# Patient Record
Sex: Male | Born: 1990 | Race: Black or African American | Marital: Single | Smoking: Current every day smoker
Health system: Southern US, Community
[De-identification: ages and names within clinical notes are randomized; demographics above are authoritative.]

---

## 2014-05-16 ENCOUNTER — Emergency Department (HOSPITAL_COMMUNITY): Payer: No Typology Code available for payment source

## 2014-05-16 ENCOUNTER — Encounter (HOSPITAL_COMMUNITY): Payer: Self-pay | Admitting: Emergency Medicine

## 2014-05-16 ENCOUNTER — Emergency Department (HOSPITAL_COMMUNITY)
Admission: EM | Admit: 2014-05-16 | Discharge: 2014-05-17 | Disposition: A | Discharge status: Home / Self Care | Payer: No Typology Code available for payment source | Attending: Emergency Medicine | Admitting: Emergency Medicine

## 2014-05-16 DIAGNOSIS — F17200 Nicotine dependence, unspecified, uncomplicated: Secondary | ICD-10-CM | POA: Insufficient documentation

## 2014-05-16 DIAGNOSIS — R51 Headache: Secondary | ICD-10-CM | POA: Insufficient documentation

## 2014-05-16 DIAGNOSIS — M2550 Pain in unspecified joint: Secondary | ICD-10-CM | POA: Insufficient documentation

## 2014-05-16 DIAGNOSIS — S40019A Contusion of unspecified shoulder, initial encounter: Secondary | ICD-10-CM | POA: Insufficient documentation

## 2014-05-16 DIAGNOSIS — R21 Rash and other nonspecific skin eruption: Secondary | ICD-10-CM | POA: Insufficient documentation

## 2014-05-16 DIAGNOSIS — Y939 Activity, unspecified: Secondary | ICD-10-CM | POA: Insufficient documentation

## 2014-05-16 DIAGNOSIS — T148XXA Other injury of unspecified body region, initial encounter: Secondary | ICD-10-CM
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
## 2014-05-16 NOTE — ED Notes (Signed)
EKG given to [facility], MD. No new orders at this time.

## 2014-05-17 ENCOUNTER — Emergency Department (HOSPITAL_COMMUNITY): Payer: No Typology Code available for payment source

## 2014-05-17 MED ORDER — IBUPROFEN 400 MG PO TABS
600.0000 mg | ORAL_TABLET | Freq: Once | ORAL | Status: AC
  Administered 2014-05-17: 600 mg via ORAL
  Filled 2014-05-17 (×2): qty 1

## 2014-05-17 MED ORDER — HYDROCODONE-ACETAMINOPHEN 5-325 MG PO TABS
1.0000 | ORAL_TABLET | Freq: Once | ORAL | Status: AC
  Administered 2014-05-17: 1 via ORAL
  Filled 2014-05-17: qty 1

## 2014-05-17 MED ORDER — IBUPROFEN 600 MG PO TABS: 600.0000 mg | ORAL_TABLET | Freq: Four times a day (QID) | ORAL | Status: DC | PRN
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
## 2015-10-25 ENCOUNTER — Emergency Department (INDEPENDENT_AMBULATORY_CARE_PROVIDER_SITE_OTHER)
Admission: EM | Admit: 2015-10-25 | Discharge: 2015-10-25 | Disposition: A | Discharge status: Home / Self Care | Payer: 59 | Source: Home / Self Care

## 2015-10-25 ENCOUNTER — Encounter (HOSPITAL_COMMUNITY): Payer: Self-pay | Admitting: Emergency Medicine

## 2015-10-25 DIAGNOSIS — K047 Periapical abscess without sinus: Secondary | ICD-10-CM | POA: Diagnosis not present

## 2015-10-25 MED ORDER — KETOROLAC TROMETHAMINE 60 MG/2ML IM SOLN
INTRAMUSCULAR | Status: AC
  Filled 2015-10-25: qty 2

## 2015-10-25 MED ORDER — AMOXICILLIN 875 MG PO TABS: 875.0000 mg | ORAL_TABLET | Freq: Two times a day (BID) | ORAL | Status: DC

## 2015-10-25 MED ORDER — KETOROLAC TROMETHAMINE 60 MG/2ML IM SOLN
60.0000 mg | Freq: Once | INTRAMUSCULAR | Status: AC
  Administered 2015-10-25: 60 mg via INTRAMUSCULAR

## 2015-10-25 NOTE — Discharge Instructions (Signed)
Abscess An abscess is an infected area that contains a collection of pus and debris.It can occur in almost any part of the body. An abscess is also known as a furuncle or boil. CAUSES  An abscess occurs when tissue gets infected. This can occur from blockage of oil or sweat glands, infection of hair follicles, or a minor injury to the skin. As the body tries to fight the infection, pus collects in the area and creates pressure under the skin. This pressure causes pain. People with weakened immune systems have difficulty fighting infections and get certain abscesses more often.  SYMPTOMS Usually an abscess develops on the skin and becomes a painful mass that is red, warm, and tender. If the abscess forms under the skin, you may feel a moveable soft area under the skin. Some abscesses break open (rupture) on their own, but most will continue to get worse without care. The infection can spread deeper into the body and eventually into the bloodstream, causing you to feel ill.  DIAGNOSIS  Your caregiver will take your medical history and perform a physical exam. A sample of fluid may also be taken from the abscess to determine what is causing your infection. TREATMENT  Your caregiver may prescribe antibiotic medicines to fight the infection. However, taking antibiotics alone usually does not cure an abscess. Your caregiver may need to make a small cut (incision) in the abscess to drain the pus. In some cases, gauze is packed into the abscess to reduce pain and to continue draining the area. HOME CARE INSTRUCTIONS   Only take over-the-counter or prescription medicines for pain, discomfort, or fever as directed by your caregiver.  If you were prescribed antibiotics, take them as directed. Finish them even if you start to feel better.  If gauze is used, follow your caregiver's directions for changing the gauze.  To avoid spreading the infection:  Keep your draining abscess covered with a  bandage.  Wash your hands well.  Do not share personal care items, towels, or whirlpools with others.  Avoid skin contact with others.  Keep your skin and clothes clean around the abscess.  Keep all follow-up appointments as directed by your caregiver. SEEK MEDICAL CARE IF:   You have increased pain, swelling, redness, fluid drainage, or bleeding.  You have muscle aches, chills, or a general ill feeling.  You have a fever. MAKE SURE YOU:   Understand these instructions.  Will watch your condition.  Will get help right away if you are not doing well or get worse.   This information is not intended to replace advice given to you by your health care provider. Make sure you discuss any questions you have with your health care provider.   Document Released: 09/13/2005 Document Revised: 06/04/2012 Document Reviewed: 02/16/2012 Elsevier Interactive Patient Education 2016 [employer]  Dental Abscess A dental abscess is a collection of pus in or around a tooth. CAUSES This condition is caused by a bacterial infection around the root of the tooth that involves the inner part of the tooth (pulp). It may result from:  Severe tooth decay.  Trauma to the tooth that allows bacteria to enter into the pulp, such as a broken or chipped tooth.  Severe gum disease around a tooth. SYMPTOMS Symptoms of this condition include:  Severe pain in and around the infected tooth.  Swelling and redness around the infected tooth, in the mouth, or in the face.  Tenderness.  Pus drainage.  Bad breath.  Bitter  taste in the mouth.  Difficulty swallowing.  Difficulty opening the mouth.  Nausea.  Vomiting.  Chills.  Swollen neck glands.  Fever. DIAGNOSIS This condition is diagnosed with examination of the infected tooth. During the exam, your dentist may tap on the infected tooth. Your dentist will also ask about your medical and dental history and may order  X-rays. TREATMENT This condition is treated by eliminating the infection. This may be done with:  Antibiotic medicine.  A root canal. This may be performed to save the tooth.  Pulling (extracting) the tooth. This may also involve draining the abscess. This is done if the tooth cannot be saved. HOME CARE INSTRUCTIONS  Take medicines only as directed by your dentist.  If you were prescribed antibiotic medicine, finish all of it even if you start to feel better.  Rinse your mouth (gargle) often with salt water to relieve pain or swelling.  Do not drive or operate heavy machinery while taking pain medicine.  Do not apply heat to the outside of your mouth.  Keep all follow-up visits as directed by your dentist. This is important. SEEK MEDICAL CARE IF:  Your pain is worse and is not helped by medicine. SEEK IMMEDIATE MEDICAL CARE IF:  You have a fever or chills.  Your symptoms suddenly get worse.  You have a very bad headache.  You have problems breathing or swallowing.  You have trouble opening your mouth.  You have swelling in your neck or around your eye.   This information is not intended to replace advice given to you by your health care provider. Make sure you discuss any questions you have with your health care provider.   Document Released: 12/04/2005 Document Revised: 04/20/2015 Document Reviewed: 12/01/2014 Elsevier Interactive Patient Education [employer]

## 2015-10-25 NOTE — ED Notes (Signed)
Pt is scheduled to have a tooth pulled in his lower left jaw on Thursday, but the pain has become too unbearable.  He has been on antibiotics for about a week, but he states he was not put on any pain medication.

## 2015-10-25 NOTE — ED Provider Notes (Signed)
CSN: [id number]     Arrival date & time 10/25/15  1318 History   None    No chief complaint on file.  (Consider location/radiation/quality/duration/timing/severity/associated sxs/prior Treatment) HPI Comments: C/o left lower molar pain.  States he was having this on his right molar and was treated with amoxicillin and this has resolved.  The history is provided by the patient.    No past medical history on file. No past surgical history on file. No family history on file. Social History  Substance Use Topics  . Smoking status: Current Every Day Smoker    Types: Cigarettes  . Smokeless tobacco: Never Used  . Alcohol Use: Yes     Comment: socially    Review of Systems  Constitutional: Negative.   HENT: Positive for dental problem.   Eyes: Negative.   Respiratory: Negative.   Cardiovascular: Negative.   Gastrointestinal: Negative.   Endocrine: Negative.   Genitourinary: Negative.   Musculoskeletal: Negative.   Skin: Negative.   Allergic/Immunologic: Negative.   Neurological: Negative.   Hematological: Negative.     Allergies  Review of patient's allergies indicates no known allergies.  Home Medications   Prior to Admission medications   Medication Sig Start Date End Date Taking? Authorizing Provider  ibuprofen (ADVIL,MOTRIN) 600 MG tablet Take 1 tablet (600 mg total) by mouth every 6 (six) hours as needed. 05/17/14   Derwood KaplanAnkit Nanavati, MD   Meds Ordered and Administered this Visit   Medications  ketorolac (TORADOL) injection 60 mg (not administered)    There were no vitals taken for this visit. No data found.   Physical Exam  Constitutional: He is oriented to person, place, and time. He appears well-developed and well-nourished.  HENT:  Head: Normocephalic.  Right Ear: External ear normal.  Left Ear: External ear normal.  Tenderness to percuss left lower 3rd molar.  Missing 2nd molar.  Eyes: Conjunctivae and EOM are normal. Pupils are equal, round, and  reactive to light.  Neck: Normal range of motion. Neck supple.  Cardiovascular: Normal rate, regular rhythm and normal heart sounds.   Pulmonary/Chest: Effort normal and breath sounds normal.  Abdominal: Soft.  Neurological: He is alert and oriented to person, place, and time.    ED Course  Procedures (including critical care time)  Labs Review Labs Reviewed - No data to display  Imaging Review No results found.   Visual Acuity Review  Right Eye Distance:   Left Eye Distance:   Bilateral Distance:    Right Eye Near:   Left Eye Near:    Bilateral Near:         MDM  Dental abscess left lower molar Toradol 60mg  IM Tylenol and motrin otc prn for pain. Amoxicillin 875mg  one po bid x 7 days #14  Deatra CanterWilliam J Charlina Dwight FNP    Deatra CanterWilliam J Tiyanna Larcom, FNP 10/25/15 [phone]

## 2017-08-24 ENCOUNTER — Encounter (HOSPITAL_BASED_OUTPATIENT_CLINIC_OR_DEPARTMENT_OTHER): Payer: Self-pay | Admitting: *Deleted

## 2017-08-24 ENCOUNTER — Emergency Department (HOSPITAL_BASED_OUTPATIENT_CLINIC_OR_DEPARTMENT_OTHER)
Admission: EM | Admit: 2017-08-24 | Discharge: 2017-08-24 | Disposition: A | Discharge status: Home / Self Care | Payer: 59 | Attending: Emergency Medicine | Admitting: Emergency Medicine

## 2017-08-24 DIAGNOSIS — F17210 Nicotine dependence, cigarettes, uncomplicated: Secondary | ICD-10-CM | POA: Insufficient documentation

## 2017-08-24 DIAGNOSIS — N492 Inflammatory disorders of scrotum: Secondary | ICD-10-CM | POA: Diagnosis present

## 2017-08-24 MED ORDER — LIDOCAINE HCL (PF) 1 % IJ SOLN: 5.0000 mL | Freq: Once | INTRAMUSCULAR | Status: DC

## 2017-08-24 MED ORDER — IBUPROFEN 400 MG PO TABS
600.0000 mg | ORAL_TABLET | Freq: Once | ORAL | Status: AC
  Administered 2017-08-24: 600 mg via ORAL
  Filled 2017-08-24: qty 1

## 2017-08-24 MED ORDER — LIDOCAINE HCL 1 % IJ SOLN
INTRAMUSCULAR | Status: AC
  Administered 2017-08-24: 20 mL
  Filled 2017-08-24: qty 20

## 2017-08-24 NOTE — ED Provider Notes (Signed)
[facility] Provider Note   CSN: [id number] Arrival date & time: 08/24/17  1753     History   Chief Complaint Chief Complaint  Patient presents with  . Abscess    HPI Dennis Farley is a 26 y.o. male without significant PMHx, Presents to the ED for acute onset of worsening painful abscess to left scrotum x 2 days. He states pain is located in the skin, without medications tried at home. He denies testicular pain, urinary symptoms, penile discharge or pain, fever/chills, abdominal pain, or other symptoms today.  The history is provided by the patient.    History reviewed. No pertinent past medical history.  There are no active problems to display for this patient.   History reviewed. No pertinent surgical history.     Home Medications    Prior to Admission medications   Medication Sig Start Date End Date Taking? Authorizing Provider  ibuprofen (ADVIL,MOTRIN) 600 MG tablet Take 1 tablet (600 mg total) by mouth every 6 (six) hours as needed. 05/17/14   Derwood KaplanNanavati, Ankit, MD    Family History No family history on file.  Social History Social History  Substance Use Topics  . Smoking status: Current Every Day Smoker    Types: Cigarettes  . Smokeless tobacco: Never Used  . Alcohol use Yes     Comment: socially     Allergies   Patient has no known allergies.   Review of Systems Review of Systems  Constitutional: Negative for chills and fever.  Gastrointestinal: Negative for anal bleeding, nausea and vomiting.  Genitourinary: Positive for scrotal swelling. Negative for discharge, dysuria, frequency, penile pain, penile swelling and testicular pain.  Skin: Positive for color change.     Physical Exam Updated Vital Signs BP 109/67 (BP Location: Right Arm)   Pulse 60   Temp 98.3 F (36.8 C) (Oral)   Resp 18   Ht 5\' 9"  (1.753 m)   Wt 63 kg (139 lb)   SpO2 99%   BMI 20.53 kg/m   Physical Exam  Constitutional: He appears well-developed and  well-nourished. No distress.  HENT:  Head: Normocephalic and atraumatic.  Eyes: Conjunctivae are normal.  Cardiovascular: Normal rate, regular rhythm, normal heart sounds and intact distal pulses.   Pulmonary/Chest: Effort normal and breath sounds normal.  Abdominal: Soft. Bowel sounds are normal. He exhibits no distension. There is no tenderness.  Genitourinary: Testes normal and penis normal. Circumcised. No penile tenderness. No discharge found.  Genitourinary Comments: Exam performed with chaperone present. Left proximal scrotal wall with 3-4cm erythematous fluctuant mass. No surrounding erythema. Testicle and spermatic cord without tenderness or erythema.  Psychiatric: He has a normal mood and affect. His behavior is normal.  Nursing note and vitals reviewed.    ED Treatments / Results  Labs (all labs ordered are listed, but only abnormal results are displayed) Labs Reviewed - No data to display  EKG  EKG Interpretation None       Radiology No results found.  Procedures .Marland Kitchen.Incision and Drainage Date/Time: 08/24/2017 10:23 PM Performed by: RUSSO, Swazil[place]JORDAN N Authorized by: RUSSO, [place]JORDAN N   Consent:    Consent obtained:  Verbal   Consent given by:  Patient   Risks discussed:  Bleeding, incomplete drainage, pain, damage to other organs and infection   Alternatives discussed:  No treatment and observation Location:    Type:  Abscess   Size:  4cm   Location:  Anogenital   Anogenital location:  Scrotal wall Pre-procedure details:  Skin preparation:  Chloraprep Anesthesia (see MAR for exact dosages):    Anesthesia method:  Local infiltration   Local anesthetic:  Lidocaine 1% w/o epi Procedure type:    Complexity:  Simple Procedure details:    Needle aspiration: yes     Needle size:  25 G   Incision types:  Single straight   Incision depth:  Dermal   Scalpel blade:  11   Wound management:  Probed and deloculated and irrigated with saline   Drainage:  Purulent and  bloody   Drainage amount:  Moderate   Wound treatment:  Wound left open   Packing materials:  None Post-procedure details:    Patient tolerance of procedure:  Tolerated well, no immediate complications    (including critical care time)  Medications Ordered in ED Medications  lidocaine (PF) (XYLOCAINE) 1 % injection 5 mL (not administered)  ibuprofen (ADVIL,MOTRIN) tablet 600 mg (not administered)  lidocaine (XYLOCAINE) 1 % (with pres) injection (20 mLs  Given 08/24/17 2157)     Initial Impression / Assessment and Plan / ED Course  I have reviewed the triage vital signs and the nursing notes.  Pertinent labs & imaging results that were available during my care of the patient were reviewed by me and considered in my medical decision making (see chart for details).     Patient with skin abscess to left proximal wall of scrotum, amenable to incision and drainage.  Abscess was not large enough to warrant packing or drain,  wound recheck in 2 days. Encouraged home warm soaks and flushing.  Mild signs of cellulitis is surrounding skin.  Pt without hx of immunocompromise, antibiotic therapy is not indicated at this time. Afebrile, not in distress, safe for discharge home.  Discussed results, findings, treatment and follow up. Patient advised of return precautions. Patient verbalized understanding and agreed with plan.  Final Clinical Impressions(s) / ED Diagnoses   Final diagnoses:  Abscess of scrotum    New Prescriptions New Prescriptions   No medications on file     Russo, [place] N, PA-C 08/24/17 2333    Loren Racer, MD 08/26/17 1718

## 2017-08-24 NOTE — ED Notes (Signed)
ED Provider at bedside. 

## 2017-08-24 NOTE — Discharge Instructions (Signed)
Please read instructions below.  Keep your wound clean and covered. Soak/flush your wound with warm water, multiple times per day. You can take Advil/ibuprofen every 6 hours as needed for pain. Follow up with your primary care, [facility] urgent care, or [facility] for wound recheck in 2 days.  Return to the ER for fever, worsening redness, or new or worsening symptoms.

## 2017-08-24 NOTE — ED Triage Notes (Signed)
Abscess above his scrotum x 2 days.

## 2017-08-26 ENCOUNTER — Encounter (HOSPITAL_COMMUNITY): Payer: Self-pay | Admitting: Emergency Medicine

## 2017-08-26 ENCOUNTER — Ambulatory Visit (HOSPITAL_COMMUNITY)
Admission: EM | Admit: 2017-08-26 | Discharge: 2017-08-26 | Disposition: A | Discharge status: Home / Self Care | Payer: 59 | Attending: Urgent Care | Admitting: Urgent Care

## 2017-08-26 DIAGNOSIS — N50819 Testicular pain, unspecified: Secondary | ICD-10-CM

## 2017-08-26 DIAGNOSIS — L02214 Cutaneous abscess of groin: Secondary | ICD-10-CM | POA: Diagnosis not present

## 2017-08-26 DIAGNOSIS — Z5189 Encounter for other specified aftercare: Secondary | ICD-10-CM

## 2017-08-26 DIAGNOSIS — R1030 Lower abdominal pain, unspecified: Secondary | ICD-10-CM

## 2017-08-26 DIAGNOSIS — R1032 Left lower quadrant pain: Secondary | ICD-10-CM

## 2017-08-26 DIAGNOSIS — L0291 Cutaneous abscess, unspecified: Secondary | ICD-10-CM

## 2017-08-26 MED ORDER — CEPHALEXIN 500 MG PO CAPS: 500.0000 mg | ORAL_CAPSULE | Freq: Three times a day (TID) | ORAL | 0 refills | Status: DC

## 2017-08-26 NOTE — ED Triage Notes (Signed)
[facility] abscess on his left testicle checked that was drained 2 days ago at [facility].

## 2017-08-26 NOTE — ED Provider Notes (Signed)
   MRN: [id number] DOB: 10/02/1991  Subjective:   Dennis Farley is a 26 y.o. male presenting for follow up on groin abscess. [facility] He had I&D performed and advised to have a wound check at our clinic. Today, reports improvement in his pain and swelling. He is performing dressing changes every ~5 hours. There is no packing. He is not taking an antibiotic. Denies fever, n/v, chills, dysuria, penile pain.   No current facility-administered medications for this encounter.    Current Outpatient Prescriptions  Medication Sig Dispense Refill  . ibuprofen (ADVIL,MOTRIN) 600 MG tablet Take 1 tablet (600 mg total) by mouth every 6 (six) hours as needed. 30 tablet 0    Dennis Farley has No Known Allergies.  Also denies past medical and surgical history.  Objective:   Vitals: BP 129/81 (BP Location: Right Arm)   Pulse (!) 59   Temp 97.9 F (36.6 C) (Oral)   Resp 16   SpO2 100%   Physical Exam  Constitutional: He is oriented to person, place, and time. He appears well-developed and well-nourished.  Cardiovascular: Normal rate.   Pulmonary/Chest: Effort normal.  Genitourinary: Right testis shows no swelling and no tenderness. Left testis shows no swelling and no tenderness. Circumcised. No penile erythema or penile tenderness. No discharge found.     Genitourinary Comments: Minimal drainage was expressed from closed wound. Patient tolerated this well.  Lymphadenopathy: Inguinal adenopathy noted on the left side. No inguinal adenopathy noted on the right side.  Neurological: He is alert and oriented to person, place, and time.   Assessment and Plan :   Visit for wound check  Abscess  Testicular pain  Groin pain, left   Improving very well. Patient is concerned that he didn't get an antibiotic. I reassured patient that he is progressing well but offered him a script for Keflex TID. Return-to-clinic precautions discussed, patient verbalized understanding.    Dennis BambergMario Zerina Hallinan, PA-C [facility] [phone] 08/26/2017 4:21 PM   Dennis BambergMani, Dennis Shenoy, PA-C 08/26/17 1641

## 2018-10-05 ENCOUNTER — Other Ambulatory Visit: Payer: Self-pay

## 2018-10-05 ENCOUNTER — Encounter (HOSPITAL_COMMUNITY): Payer: Self-pay | Admitting: Emergency Medicine

## 2018-10-05 ENCOUNTER — Ambulatory Visit (HOSPITAL_COMMUNITY)
Admission: EM | Admit: 2018-10-05 | Discharge: 2018-10-05 | Disposition: A | Discharge status: Home / Self Care | Payer: 59 | Attending: Internal Medicine | Admitting: Internal Medicine

## 2018-10-05 DIAGNOSIS — K047 Periapical abscess without sinus: Secondary | ICD-10-CM

## 2018-10-05 MED ORDER — HYDROCODONE-ACETAMINOPHEN 5-325 MG PO TABS: 1.0000 | ORAL_TABLET | Freq: Four times a day (QID) | ORAL | 0 refills | Status: DC | PRN

## 2018-10-05 MED ORDER — AMOXICILLIN-POT CLAVULANATE 875-125 MG PO TABS: 1.0000 | ORAL_TABLET | Freq: Two times a day (BID) | ORAL | 0 refills | Status: AC

## 2018-10-05 MED ORDER — IBUPROFEN 800 MG PO TABS: 800.0000 mg | ORAL_TABLET | Freq: Three times a day (TID) | ORAL | 0 refills | Status: DC

## 2018-10-05 NOTE — Discharge Instructions (Signed)
Please use dental resource to contact offices to seek permenant treatment/relief.   Today we have given you an antibiotic. This should help with pain as any infection is cleared.   For pain please take 600mg -800mg  of Ibuprofen every 8 hours, take with 1000 mg of Tylenol Extra strength every 8 hours. These are safe to take together. Please take with food.   I have also provided 2 days worth of stronger pain medication. This should only be used for severe pain. Do not drive or operate machinery while taking this medication.   Please return if you start to experience significant swelling of your face, experiencing fever, neck swelling

## 2018-10-05 NOTE — ED Triage Notes (Signed)
The patient presented to the [facility] with a complaint of mouth and jaw pain x 2 days.

## 2018-10-05 NOTE — ED Provider Notes (Signed)
[facility]    CSN: [id number] Arrival date & time: 10/05/18  1624     History   Chief Complaint Chief Complaint  Patient presents with  . Jaw Pain    HPI Dennis Farley is a 27 y.o. male no significant past medical history presenting today for evaluation of right-sided jaw pain and swelling.  Patient states that yesterday evening he started to develop a throbbing sensation in his lower right jaw.  Notes that he has a broken tooth in this area and recently a larger portion of it broke off approximately 2 weeks ago.  When he woke up this morning he had increased swelling to this area of his jaw.  He has tried salt water gargles, ice, aspirin with mild relief.  Pain with talking.  Occasional sharp stabbing pains.  Some mild neck discomfort, but denies difficulty moving neck.  Denies fevers.  Does not currently have dental insurance.  HPI  History reviewed. No pertinent past medical history.  There are no active problems to display for this patient.   History reviewed. No pertinent surgical history.     Home Medications    Prior to Admission medications   Medication Sig Start Date End Date Taking? Authorizing Provider  amoxicillin-clavulanate (AUGMENTIN) 875-125 MG tablet Take 1 tablet by mouth every 12 (twelve) hours for 7 days. 10/05/18 10/12/18  Haeden Hudock C, PA-C  HYDROcodone-acetaminophen (NORCO/VICODIN) 5-325 MG tablet Take 1 tablet by mouth every 6 (six) hours as needed. 10/05/18   Cleva Camero C, PA-C  ibuprofen (ADVIL,MOTRIN) 800 MG tablet Take 1 tablet (800 mg total) by mouth 3 (three) times daily. 10/05/18   Quinci Gavidia, Junius Creamer, PA-C    Family History History reviewed. No pertinent family history.  Social History Social History   Tobacco Use  . Smoking status: Current Every Day Smoker    Types: Cigarettes  . Smokeless tobacco: Never Used  Substance Use Topics  . Alcohol use: Yes    Comment: socially  . Drug use: No     Allergies     Patient has no known allergies.   Review of Systems Review of Systems  Constitutional: Negative for fatigue and fever.  HENT: Positive for dental problem and facial swelling. Negative for congestion, sinus pressure, sore throat and trouble swallowing.   Eyes: Negative for photophobia, pain and visual disturbance.  Respiratory: Negative for cough and shortness of breath.   Cardiovascular: Negative for chest pain.  Gastrointestinal: Negative for abdominal pain, nausea and vomiting.  Genitourinary: Negative for decreased urine volume and hematuria.  Musculoskeletal: Negative for myalgias, neck pain and neck stiffness.  Neurological: Negative for dizziness, syncope, facial asymmetry, speech difficulty, weakness, light-headedness, numbness and headaches.     Physical Exam Triage Vital Signs ED Triage Vitals  Enc Vitals Group     BP 10/05/18 1702 115/62     Pulse Rate 10/05/18 1702 67     Resp 10/05/18 1702 18     Temp 10/05/18 1702 98.7 F (37.1 C)     Temp Source 10/05/18 1702 Oral     SpO2 10/05/18 1702 100 %     Weight --      Height --      Head Circumference --      Peak Flow --      Pain Score 10/05/18 1700 8     Pain Loc --      Pain Edu? --      Excl. in GC? --    No data  found.  Updated Vital Signs BP 115/62 (BP Location: Left Arm)   Pulse 67   Temp 98.7 F (37.1 C) (Oral)   Resp 18   SpO2 100%   Visual Acuity Right Eye Distance:   Left Eye Distance:   Bilateral Distance:    Right Eye Near:   Left Eye Near:    Bilateral Near:     Physical Exam  Constitutional: He appears well-developed and well-nourished.  HENT:  Head: Normocephalic and atraumatic.  Mouth/Throat: Oropharynx is clear and moist.  Overall poor dentition with multiple discolored teeth, right lower posterior molar fractured, tenderness to palpation of surrounding gingiva and surrounding swelling, does not extend a soft palate Nontender to palpation beneath tongue  Eyes: Conjunctivae are  normal.  Neck: Neck supple.  Full active range of motion of neck  Cardiovascular: Normal rate and regular rhythm.  No murmur heard. Pulmonary/Chest: Effort normal and breath sounds normal. No respiratory distress.  Abdominal: Soft. There is no tenderness.  Musculoskeletal: He exhibits no edema.  Neurological: He is alert.  Skin: Skin is warm and dry.  Psychiatric: He has a normal mood and affect.  Nursing note and vitals reviewed.    UC Treatments / Results  Labs (all labs ordered are listed, but only abnormal results are displayed) Labs Reviewed - No data to display  EKG None  Radiology No results found.  Procedures Procedures (including critical care time)  Medications Ordered in UC Medications - No data to display  Initial Impression / Assessment and Plan / UC Course  I have reviewed the triage vital signs and the nursing notes.  Pertinent labs & imaging results that were available during my care of the patient were reviewed by me and considered in my medical decision making (see chart for details).     Patient has facial swelling, most likely dental abscess/infection.  Will begin patient on Augmentin.  Tylenol and ibuprofen for dental pain.  Did provide patient with 6 tablets of hydrocodone to use for severe pain.  Advised only use at bedtime, do not drive or operate machinery after taking.  Continue to monitor swelling and follow-up if not improving or worsening.Discussed strict return precautions. Patient verbalized understanding and is agreeable with plan.  Final Clinical Impressions(s) / UC Diagnoses   Final diagnoses:  Dental abscess     Discharge Instructions     Please use dental resource to contact offices to seek permenant treatment/relief.   Today we have given you an antibiotic. This should help with pain as any infection is cleared.   For pain please take 600mg -800mg  of Ibuprofen every 8 hours, take with 1000 mg of Tylenol Extra strength every 8  hours. These are safe to take together. Please take with food.   I have also provided 2 days worth of stronger pain medication. This should only be used for severe pain. Do not drive or operate machinery while taking this medication.   Please return if you start to experience significant swelling of your face, experiencing fever, neck swelling   ED Prescriptions    Medication Sig Dispense Auth. Provider   amoxicillin-clavulanate (AUGMENTIN) 875-125 MG tablet Take 1 tablet by mouth every 12 (twelve) hours for 7 days. 14 tablet Ellamay Fors C, PA-C   ibuprofen (ADVIL,MOTRIN) 800 MG tablet Take 1 tablet (800 mg total) by mouth 3 (three) times daily. 21 tablet Marissa Weaver C, PA-C   HYDROcodone-acetaminophen (NORCO/VICODIN) 5-325 MG tablet Take 1 tablet by mouth every 6 (six) hours as needed.  6 tablet Vedder Brittian, [place] C, PA-C     Controlled Substance Prescriptions [place] Controlled Substance Registry consulted? Yes, I have consulted the [place] Controlled Substances Registry for this patient, and feel the risk/benefit ratio today is favorable for proceeding with this prescription for a controlled substance.   Sharyon Cable Whittemore C, PA-C 10/05/18 1732

## 2019-04-13 ENCOUNTER — Encounter (HOSPITAL_COMMUNITY): Payer: Self-pay | Admitting: *Deleted

## 2019-04-13 ENCOUNTER — Emergency Department (HOSPITAL_COMMUNITY)
Admission: EM | Admit: 2019-04-13 | Discharge: 2019-04-13 | Disposition: A | Discharge status: Home / Self Care | Payer: 59 | Attending: Emergency Medicine | Admitting: Emergency Medicine

## 2019-04-13 DIAGNOSIS — L02214 Cutaneous abscess of groin: Secondary | ICD-10-CM | POA: Diagnosis present

## 2019-04-13 DIAGNOSIS — N492 Inflammatory disorders of scrotum: Secondary | ICD-10-CM | POA: Diagnosis not present

## 2019-04-13 DIAGNOSIS — Z79899 Other long term (current) drug therapy: Secondary | ICD-10-CM | POA: Diagnosis not present

## 2019-04-13 DIAGNOSIS — F17210 Nicotine dependence, cigarettes, uncomplicated: Secondary | ICD-10-CM | POA: Insufficient documentation

## 2019-04-13 MED ORDER — SULFAMETHOXAZOLE-TRIMETHOPRIM 800-160 MG PO TABS: 1.0000 | ORAL_TABLET | Freq: Two times a day (BID) | ORAL | 0 refills | Status: DC

## 2019-04-13 MED ORDER — HYDROCODONE-ACETAMINOPHEN 5-325 MG PO TABS: 1.0000 | ORAL_TABLET | Freq: Four times a day (QID) | ORAL | 0 refills | Status: DC | PRN

## 2019-04-13 MED ORDER — LIDOCAINE HCL (PF) 1 % IJ SOLN
10.0000 mL | Freq: Once | INTRAMUSCULAR | Status: AC
  Administered 2019-04-13: 10 mL
  Filled 2019-04-13: qty 30

## 2019-04-13 MED ORDER — NAPROXEN 500 MG PO TABS: 500.0000 mg | ORAL_TABLET | Freq: Two times a day (BID) | ORAL | 0 refills | Status: DC | PRN

## 2019-04-13 NOTE — Discharge Instructions (Signed)
Keep wound clean and dry. Apply warm compresses to affected area throughout the day, and perform warm sitz baths several times a day to help with the pain and swelling. Take antibiotic until it is finished. Take naprosyn and norco as directed, as needed for pain but do not drive or operate machinery with pain medication use. Follow-up with [facility]Primary Care doctor in 3-5 days for wound recheck. Monitor area for signs of infection to include, but not limited to: increasing pain, spreading redness, drainage/pus, worsening swelling, or fevers. Return to emergency department for emergent changing or worsening symptoms.

## 2019-04-13 NOTE — ED Triage Notes (Signed)
Pt complains of left scrotal abscess since yesterday. Pt states it is painful and has gotten worse. Pt was sent from urgent care.

## 2019-04-13 NOTE — ED Provider Notes (Signed)
[facility]-EMERGENCY DEPT Provider Note   CSN: [id number] Arrival date & time: 04/13/19  1318    History   Chief Complaint Chief Complaint  Patient presents with  . Abscess    HPI    Dennis Farley is a 28 y.o. male who presents to the ED with complaints of "boil" on the skin of his scrotum that began yesterday.  Patient has had something similar in the past, required I&D about 2 years ago.  He reports that he developed an area of swelling to the skin of the scrotum on the left-hand side.  He now complains of 6/10 intermittent stabbing nonradiating pain in that area, worse with laying on his left side or applying pressure to the area, and with no treatments tried prior to arrival.  He reports that he took some leftover antibiotics from his dentist, but he is not sure the name of it.  He reports associated swelling, erythema, and warmth to the area.  He denies any red streaking, testicular pain or swelling, scrotal swelling, penile discharge, dysuria, hematuria, abdominal pain, nausea, vomiting, diarrhea, constipation, fevers, chills, or any other complaints at this time.  The history is provided by the patient and medical records. No language interpreter was used.  Abscess  Associated symptoms: no fever, no nausea and no vomiting     History reviewed. No pertinent past medical history.  There are no active problems to display for this patient.   History reviewed. No pertinent surgical history.      Home Medications    Prior to Admission medications   Medication Sig Start Date End Date Taking? Authorizing Provider  amoxicillin (AMOXIL) 500 MG capsule Take 2 capsules by mouth every 8 (eight) hours. 03/13/19  Yes [provider]  HYDROcodone-acetaminophen (NORCO/VICODIN) 5-325 MG tablet Take 1 tablet by mouth every 6 (six) hours as needed. Patient not taking: Reported on 04/13/2019 10/05/18   Wieters, Hallie C, PA-C  ibuprofen (ADVIL,MOTRIN) 800 MG  tablet Take 1 tablet (800 mg total) by mouth 3 (three) times daily. Patient not taking: Reported on 04/13/2019 10/05/18   Lew Dawes, PA-C    Family History No family history on file.  Social History Social History   Tobacco Use  . Smoking status: Current Every Day Smoker    Types: Cigarettes  . Smokeless tobacco: Never Used  Substance Use Topics  . Alcohol use: Yes    Comment: socially  . Drug use: No     Allergies   Patient has no known allergies.   Review of Systems Review of Systems  Constitutional: Negative for chills and fever.  Gastrointestinal: Negative for abdominal pain, constipation, diarrhea, nausea and vomiting.  Genitourinary: Positive for genital sores (boil). Negative for discharge, dysuria, hematuria, penile pain, scrotal swelling and testicular pain.  Skin: Positive for color change.  Allergic/Immunologic: Negative for immunocompromised state.     Physical Exam Updated Vital Signs BP 125/74 (BP Location: Left Arm)   Pulse 76   Temp 98.2 F (36.8 C) (Oral)   Resp 18   SpO2 99%   Physical Exam Vitals signs and nursing note reviewed. Exam conducted with a chaperone present.  Constitutional:      General: He is not in acute distress.    Appearance: Normal appearance. He is well-developed. He is not toxic-appearing.     Comments: Afebrile, nontoxic, NAD  HENT:     Head: Normocephalic and atraumatic.  Eyes:     General:  Right eye: No discharge.        Left eye: No discharge.     Conjunctiva/sclera: Conjunctivae normal.  Neck:     Musculoskeletal: Normal range of motion and neck supple.  Cardiovascular:     Rate and Rhythm: Normal rate.     Pulses: Normal pulses.  Pulmonary:     Effort: Pulmonary effort is normal. No respiratory distress.  Abdominal:     General: There is no distension.  Genitourinary:    Penis: Normal.      Scrotum/Testes: Normal.        Right: Tenderness or swelling not present.        Left: Tenderness or  swelling not present.     Epididymis:     Right: Normal.     Left: Normal.       Comments: Chaperone present during exam.  Small ~2-3cm spherical fluctuant abscess to superior left scrotal skin, minimally erythematous and warm to touch, mildly TTP, no surrounding cellulitis, slight induration to the periphery of the abscess but none to the remainder of the scrotum, no necrotic skin, no other areas of tenderness to the remainder of the scrotum or testicles, no tenderness to the epididymis, no swelling of the scrotum itself. Penis normal appearing.  Musculoskeletal: Normal range of motion.  Skin:    General: Skin is warm and dry.     Findings: No rash.  Neurological:     Mental Status: He is alert and oriented to person, place, and time.     Sensory: Sensation is intact. No sensory deficit.     Motor: Motor function is intact.  Psychiatric:        Mood and Affect: Mood and affect normal.        Behavior: Behavior normal.      ED Treatments / Results  Labs (all labs ordered are listed, but only abnormal results are displayed) Labs Reviewed - No data to display  EKG None  Radiology No results found.  Procedures .Marland KitchenIncision and Drainage Date/Time: 04/13/2019 4:20 PM Performed by: Rhona Raider, PA-C Authorized by: Rhona Raider, [place]   Consent:    Consent obtained:  Verbal   Consent given by:  Patient   Risks discussed:  Pain, damage to other organs, bleeding and incomplete drainage   Alternatives discussed:  Alternative treatment Location:    Type:  Abscess   Size:  2-3cm   Location:  Anogenital   Anogenital location:  Scrotal wall Pre-procedure details:    Skin preparation:  Betadine Anesthesia (see MAR for exact dosages):    Anesthesia method:  Local infiltration   Local anesthetic:  Lidocaine 1% w/o epi Procedure type:    Complexity:  Simple Procedure details:    Needle aspiration: no     Incision types:  Stab incision   Incision depth:  Subcutaneous    Scalpel blade:  11   Wound management:  Probed and deloculated   Drainage:  Purulent   Drainage amount:  Moderate   Wound treatment:  Wound left open   Packing materials:  None Post-procedure details:    Patient tolerance of procedure:  Tolerated well, no immediate complications   (including critical care time)  Medications Ordered in ED Medications  lidocaine (PF) (XYLOCAINE) 1 % injection 10 mL (10 mLs Infiltration Given by Other 04/13/19 1554)     Initial Impression / Assessment and Plan / ED Course  I have reviewed the triage vital signs and the nursing notes.  Pertinent labs & imaging results  that were available during my care of the patient were reviewed by me and considered in my medical decision making (see chart for details).        28 y.o. male here with left scrotal abscess that began yesterday.  History of same.  On exam, approximately 2 to 3 cm spherical fluctuant abscess with minimal induration along the periphery, mildly erythematous and slightly warm to the touch, no significant surrounding cellulitis, no evidence of Fournier's gangrene.  No testicular pain or swelling of the scrotal sac.  Suspect localized abscess of scrotal wall superiorly; doesn't appear to overly any of the important structures of the GU area.  He has had this before and required I&D.  Doubt need for imaging or labs.  Will proceed with I&D.  Discussed case with my attending Dr. Charm BargesButler who agrees with plan.  4:45 PM I&D performed and successful for moderate amount of purulent drainage from the abscess, pt tolerated well. Not deep enough to warrant packing. Given some induration and erythema locally, although no surrounding cellulitis outside of the local area, but will start on abx to cover for residual infection. Advised warm compresses and sitz baths, will send home with pain meds. F/up with PCP in 3-5 days for recheck. Strict return precautions advised. I explained the diagnosis and have given  explicit precautions to return to the ER including for any other new or worsening symptoms. The patient understands and accepts the medical plan as it's been dictated and I have answered their questions. Discharge instructions concerning home care and prescriptions have been given. The patient is STABLE and is discharged to home in good condition.    Final Clinical Impressions(s) / ED Diagnoses   Final diagnoses:  Scrotal wall abscess    ED Discharge Orders         Ordered    naproxen (NAPROSYN) 500 MG tablet  2 times daily PRN     04/13/19 1640    HYDROcodone-acetaminophen (NORCO) 5-325 MG tablet  Every 6 hours PRN     04/13/19 1640    sulfamethoxazole-trimethoprim (BACTRIM DS) 800-160 MG tablet  2 times daily     04/13/19 [address]1640           Deshanda Molitor, [place] Ward[place]cedes, [place]PA-C 04/13/19 1646    Terrilee FilesButler, Michael C, MD 04/14/19 1147

## 2024-09-02 ENCOUNTER — Other Ambulatory Visit: Payer: Self-pay

## 2024-09-02 ENCOUNTER — Ambulatory Visit (INDEPENDENT_AMBULATORY_CARE_PROVIDER_SITE_OTHER)

## 2024-09-02 VITALS — BP 114/70 | HR 68 | Temp 98.4°F | Ht 69.0 in | Wt 141.0 lb

## 2024-09-02 DIAGNOSIS — Z139 Encounter for screening, unspecified: Secondary | ICD-10-CM | POA: Diagnosis not present

## 2024-09-02 DIAGNOSIS — F17200 Nicotine dependence, unspecified, uncomplicated: Secondary | ICD-10-CM | POA: Diagnosis not present

## 2024-09-02 DIAGNOSIS — L739 Follicular disorder, unspecified: Secondary | ICD-10-CM | POA: Diagnosis not present

## 2024-09-02 MED ORDER — MUPIROCIN 2 % EX OINT: 1.0000 | TOPICAL_OINTMENT | Freq: Two times a day (BID) | CUTANEOUS | 4 refills | Status: AC

## 2024-09-02 NOTE — Progress Notes (Signed)
 Patient notified of all instructions.verbal understanding

## 2024-09-02 NOTE — Progress Notes (Signed)
 New Patient Visit   Physician: Crew Goren A Ryleah Miramontes, MD  Patient: Dennis Farley   DOB: 07/20/1991   33 y.o. Male  MRN: [id number] Visit Date: 09/02/2024   Chief Complaint  Patient presents with   Establish Care   Subjective  Dennis Farley is a 33 y.o. male who presents today as a new patient to establish care.   HPI  Discussed the use of AI scribe software for clinical note transcription with the patient, who gave verbal consent to proceed.  History of Present Illness   Dennis Farley is a 33 year old male who presents with:   Recurrent cutaneous abscesses and folliculitis - Recurrent boils in the pubic area for the past six years, primarily where hair grows - Lesions resemble ingrown hairs, occasionally become painful and drain - Boils located in the groin area, including the scrotum, but not on the penis - More frequent in areas with increased hair growth - Occasional involvement of the axillae, which are extremely painful when affected - No burning with urination, penile discharge, or penile lesions - History of cyst drainage at the hospital - Maintains cleanliness in affected areas to manage symptoms  - Single married partner 9 years, no chance of STD  Environmental and occupational factors - 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    He does typically obtain vaccines.     ASSESSMENT & PLAN  Encounter Diagnoses  Name Primary?   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 D. The condition is treatable and potentially preventable with proper hygiene and topical treatments. - Prescribe benzoyl peroxide wash for weekly use to reduce bacterial count and prevent abscess formation. - Prescribe mupirocin  topical ointment for use on abscesses when he appears. - Advise warm compresses to encourage drainage of abscesses. - Advise to contact the clinic if abscesses become large, do not drain, or cause significant problems, as antibiotics may be necessary.  Tobacco use Chronic tobacco use since age 53, currently smoking 3-4 Black & Mild cigars per day. Acknowledged the potential for future health issues related to smoking. Discussed the difficulty of quitting and the availability of resources to assist with cessation. - Discuss smoking cessation options and resources available to assist with quitting.       Objective  BP 114/70 (BP Location: Right Arm, Patient Position: Sitting, Cuff Size: Normal)   Pulse 68   Temp 98.4 F (36.9 C) (Oral)   Ht 5' 9 (1.753 m)   Wt 141 lb (64 kg)   BMI 20.82 kg/m      Review of Systems  Constitutional:  Negative for chills, fever and weight loss.  Eyes:  Negative for blurred vision. h Respiratory:  Negative for  cough and shortness of breath.   Cardiovascular:  Negative for chest pain and palpitations.  Skin:  Negative for rash.  Psychiatric/Behavioral:  Negative for depression. The patient is not nervous/anxious.      Physical Exam Physical Exam Vitals reviewed.  Constitutional:      Appearance: Normal appearance. Well-developed with normal weight.  HENT:     Head: Normocephalic and atraumatic.  Normal mucous  membranes, no oral lesions Eyes:     Pupils: Pupils are equal, round, and reactive to light.  Neck:     Thyroid: No thyroid mass or thyromegaly.  Cardiovascular:     Rate and Rhythm: Normal rate and regular rhythm. Normal heart sounds. Normal peripheral pulses Pulmonary:     Normal breath sounds with normal effort Abdominal:   Abdomen is soft, without tenderness or noted hepatosplenomegaly Musculoskeletal:        General: No swelling or edema  Lymphadenopathy:     Cervical: No cervical adenopathy.  Skin:    General: Skin is warm and dry without noticeable rash.  Groin small pustule with surrounding erythema noted on testicle.  Neurological:     General: No focal deficit present.  Psychiatric:        Mood and Affect: Mood, behavior and cognition normal   History reviewed. No pertinent past medical history. Past Surgical History:  Procedure Laterality Date   MOUTH SURGERY     Family Status  Relation Name Status   Mother  Alive   Father  Alive   MGM  Alive   MGF  Deceased   PGM  Deceased   PGF  Deceased  No partnership data on file   Family History  Problem Relation Age of Onset   Hypertension Maternal Grandmother    Social History   Socioeconomic History   Marital status: Single    Spouse name: Not on file   Number of children: Not on file   Years of education: Not on file   Highest education level: 12th grade  Occupational History   Not on file  Tobacco Use   Smoking status: Every Day    Types: Cigarettes   Smokeless tobacco: Never  Vaping Use   Vaping status: Never Used  Substance and Sexual Activity   Alcohol use: Not Currently    Comment: socially   Drug use: No   Sexual activity: Not on file  Other Topics Concern   Not on file  Social History Narrative   Not on file   Social Drivers of Health   Financial Resource Strain: Low Risk  (08/29/2024)   Overall Financial Resource Strain (CARDIA)    Difficulty of Paying Living Expenses: Not very hard   Food Insecurity: Food Insecurity Present (08/29/2024)   Hunger Vital Sign    Worried About Running Out of Food in the Last Year: Sometimes true    Ran Out of Food in the Last Year: Sometimes true  Transportation Needs: No Transportation Needs (08/29/2024)   PRAPARE - Administrator, Civil Service (Medical): No    Lack of Transportation (Non-Medical): No  Physical Activity: Inactive (08/29/2024)   Exercise Vital Sign    Days of Exercise per Week: 0 days    Minutes of Exercise per Session: Not on file  Stress: No Stress Concern Present (08/29/2024)   [employer] of Occupational Health - Occupational Stress Questionnaire    Feeling of Stress: Only a little  Social Connections: Moderately Isolated (08/29/2024)   Social Connection and Isolation Panel  Frequency of Communication with Friends and Family: More than three times a week    Frequency of Social Gatherings with Friends and Family: Once a week    Attends Religious Services: Never    Database administrator or Organizations: No    Attends Engineer, structural: Not on file    Marital Status: Living with partner   No outpatient medications prior to visit.   No facility-administered medications prior to visit.   No Known Allergies   There is no immunization history on file for this patient.  Health Maintenance  Topic Date Due   HIV Screening  Never done   Hepatitis C Screening  Never done   DTaP/Tdap/Td (1 - Tdap) Never done   Pneumococcal Vaccine (1 of 2 - PCV) Never done   Hepatitis B Vaccines 19-59 Average Risk (1 of 3 - 19+ 3-dose series) Never done   HPV VACCINES (1 - 3-dose SCDM series) Never done   Influenza Vaccine  Never done   COVID-19 Vaccine (1 - 2024-25 season) Never done   Meningococcal B Vaccine  Aged Out    Patient Care Team: Patient, No Pcp Per as PCP - General (General Practice)  Depression Screen    09/02/2024    8:33 AM  PHQ 2/9 Scores  PHQ - 2 Score 1  PHQ- 9 Score 5      Parris DELENA Juneau, MD  [facility] [phone] (phone) [phone] (fax)  [facility]

## 2024-09-03 ENCOUNTER — Ambulatory Visit: Payer: Self-pay

## 2024-09-03 LAB — COMPREHENSIVE METABOLIC PANEL WITH GFR
AG Ratio: 2 (calc) (ref 1.0–2.5)
ALT: 27 U/L (ref 9–46)
AST: 61 U/L — ABNORMAL HIGH (ref 10–40)
Albumin: 4.5 g/dL (ref 3.6–5.1)
Alkaline phosphatase (APISO): 51 U/L (ref 36–130)
BUN: 12 mg/dL (ref 7–25)
CO2: 29 mmol/L (ref 20–32)
Calcium: 9.1 mg/dL (ref 8.6–10.3)
Chloride: 105 mmol/L (ref 98–110)
Creat: 0.97 mg/dL (ref 0.60–1.26)
Globulin: 2.3 g/dL (ref 1.9–3.7)
Glucose, Bld: 97 mg/dL (ref 65–99)
Potassium: 4.8 mmol/L (ref 3.5–5.3)
Sodium: 140 mmol/L (ref 135–146)
Total Bilirubin: 0.2 mg/dL (ref 0.2–1.2)
Total Protein: 6.8 g/dL (ref 6.1–8.1)
eGFR: 106 mL/min/1.73m2 (ref >=60)

## 2024-09-03 LAB — CBC WITH DIFFERENTIAL/PLATELET
Absolute Lymphocytes: 1869 {cells}/uL (ref 850–3900)
Absolute Monocytes: 574 {cells}/uL (ref 200–950)
Basophils Absolute: 7 {cells}/uL (ref 0–200)
Basophils Relative: 0.1 %
Eosinophils Absolute: 42 {cells}/uL (ref 15–500)
Eosinophils Relative: 0.6 %
HCT: 48.1 % (ref 38.5–50.0)
Hemoglobin: 16 g/dL (ref 13.2–17.1)
MCH: 28.9 pg (ref 27.0–33.0)
MCHC: 33.3 g/dL (ref 32.0–36.0)
MCV: 87 fL (ref 80.0–100.0)
MPV: 10.6 fL (ref 7.5–12.5)
Monocytes Relative: 8.2 %
Neutro Abs: 4508 {cells}/uL (ref 1500–7800)
Neutrophils Relative %: 64.4 %
Platelets: 188 Thousand/uL (ref 140–400)
RBC: 5.53 Million/uL (ref 4.20–5.80)
RDW: 12.9 % (ref 11.0–15.0)
Total Lymphocyte: 26.7 %
WBC: 7 Thousand/uL (ref 3.8–10.8)

## 2024-09-03 LAB — LIPID PANEL
Cholesterol: 135 mg/dL (ref <200)
HDL: 42 mg/dL (ref >=40)
LDL Cholesterol (Calc): 79 mg/dL
Non-HDL Cholesterol (Calc): 93 mg/dL (ref <130)
Total CHOL/HDL Ratio: 3.2 (calc) (ref <5.0)
Triglycerides: 61 mg/dL (ref <150)

## 2024-09-03 LAB — HEMOGLOBIN A1C
Hgb A1c MFr Bld: 5.3 % (ref <5.7)
Mean Plasma Glucose: 105 mg/dL
eAG (mmol/L): 5.8 mmol/L

## 2024-09-24 ENCOUNTER — Ambulatory Visit

## 2024-10-09 ENCOUNTER — Ambulatory Visit

## 2025-01-22 ENCOUNTER — Encounter (HOSPITAL_COMMUNITY): Payer: Self-pay

## 2025-01-22 ENCOUNTER — Ambulatory Visit (HOSPITAL_COMMUNITY)
Admission: EM | Admit: 2025-01-22 | Discharge: 2025-01-22 | Disposition: A | Discharge status: Home / Self Care | Source: Ambulatory Visit

## 2025-01-22 DIAGNOSIS — J101 Influenza due to other identified influenza virus with other respiratory manifestations: Secondary | ICD-10-CM | POA: Diagnosis not present

## 2025-01-22 MED ORDER — ONDANSETRON 4 MG PO TBDP: 4.0000 mg | ORAL_TABLET | Freq: Three times a day (TID) | ORAL | 0 refills | Status: AC | PRN

## 2025-01-22 MED ORDER — BENZONATATE 100 MG PO CAPS: 100.0000 mg | ORAL_CAPSULE | Freq: Three times a day (TID) | ORAL | 0 refills | Status: AC | PRN

## 2025-01-22 MED ORDER — OSELTAMIVIR PHOSPHATE 75 MG PO CAPS: 75.0000 mg | ORAL_CAPSULE | Freq: Two times a day (BID) | ORAL | 0 refills | Status: AC

## 2025-01-22 NOTE — ED Provider Notes (Signed)
 " [facility]    CSN: [id number] Arrival date & time: 01/22/25  1351      History   Chief Complaint Chief Complaint  Patient presents with   Cough    HPI Dennis Farley is a 34 y.o. male.   Patient presents today for 2-day history of tactile fever, body aches and chills, congested cough, shortness of breath with coughing, runny and stuffy nose, headache, nausea without vomiting, decreased appetite, and fatigue.  No chest pain, sore throat, ear pain, abdominal pain, or vomiting.  Reports may have been exposed to sick coworkers.  Has taken Advil  which helps with symptoms temporarily.  Reports positive at-home influenza A test.    History reviewed. No pertinent past medical history.  Patient Active Problem List   Diagnosis Date Noted   Folliculitis 09/02/2024   Smoker 09/02/2024    Past Surgical History:  Procedure Laterality Date   MOUTH SURGERY         Home Medications    Prior to Admission medications  Medication Sig Start Date End Date Taking? Authorizing Provider  benzonatate  (TESSALON ) 100 MG capsule Take 1 capsule (100 mg total) by mouth 3 (three) times daily as needed for cough. Do not take with alcohol or while operating or driving heavy machinery 06/20/72  Yes Chandra Raisin A, NP  ondansetron  (ZOFRAN -ODT) 4 MG disintegrating tablet Take 1 tablet (4 mg total) by mouth every 8 (eight) hours as needed for nausea or vomiting. 01/22/25  Yes Chandra Raisin LABOR, NP  oseltamivir  (TAMIFLU ) 75 MG capsule Take 1 capsule (75 mg total) by mouth every 12 (twelve) hours for 5 days. 01/22/25 01/27/25 Yes Chandra Raisin LABOR, NP  mupirocin  ointment (BACTROBAN ) 2 % Apply 1 Application topically 2 (two) times daily. 09/02/24   Zafirov, Clarissa A, MD    Family History Family History  Problem Relation Age of Onset   Hypertension Maternal Grandmother     Social History Social History[1]   Allergies   Patient has no known allergies.   Review of Systems Review of  Systems Per HPI  Physical Exam Triage Vital Signs ED Triage Vitals  Encounter Vitals Group     BP 01/22/25 1412 106/75     Girls Systolic BP Percentile --      Girls Diastolic BP Percentile --      Boys Systolic BP Percentile --      Boys Diastolic BP Percentile --      Pulse Rate 01/22/25 1412 97     Resp 01/22/25 1412 16     Temp 01/22/25 1412 98.4 F (36.9 C)     Temp Source 01/22/25 1412 Oral     SpO2 01/22/25 1412 98 %     Weight --      Height --      Head Circumference --      Peak Flow --      Pain Score 01/22/25 1411 0     Pain Loc --      Pain Education --      Exclude from Growth Chart --    No data found.  Updated Vital Signs BP 106/75 (BP Location: Right Arm)   Pulse 97   Temp 98.4 F (36.9 C) (Oral)   Resp 16   SpO2 98%   Visual Acuity Right Eye Distance:   Left Eye Distance:   Bilateral Distance:    Right Eye Near:   Left Eye Near:    Bilateral Near:     Physical Exam  Vitals and nursing note reviewed.  Constitutional:      General: He is not in acute distress.    Appearance: Normal appearance. He is ill-appearing. He is not toxic-appearing.  HENT:     Head: Normocephalic and atraumatic.     Right Ear: Tympanic membrane, ear canal and external ear normal.     Left Ear: Tympanic membrane, ear canal and external ear normal.     Nose: No congestion or rhinorrhea.     Mouth/Throat:     Mouth: Mucous membranes are moist.     Pharynx: Oropharynx is clear. No oropharyngeal exudate or posterior oropharyngeal erythema.  Eyes:     General: No scleral icterus.    Extraocular Movements: Extraocular movements intact.  Cardiovascular:     Rate and Rhythm: Normal rate and regular rhythm.  Pulmonary:     Effort: Pulmonary effort is normal. No respiratory distress.     Breath sounds: Normal breath sounds. No wheezing, rhonchi or rales.  Musculoskeletal:     Cervical back: Normal range of motion and neck supple.  Lymphadenopathy:     Cervical: No  cervical adenopathy.  Skin:    General: Skin is warm and dry.     Coloration: Skin is not jaundiced or pale.     Findings: No erythema or rash.  Neurological:     Mental Status: He is alert and oriented to person, place, and time.  Psychiatric:        Behavior: Behavior is cooperative.      UC Treatments / Results  Labs (all labs ordered are listed, but only abnormal results are displayed) Labs Reviewed - No data to display  EKG   Radiology No results found.  Procedures Procedures (including critical care time)  Medications Ordered in UC Medications - No data to display  Initial Impression / Assessment and Plan / UC Course  I have reviewed the triage vital signs and the nursing notes.  Pertinent labs & imaging results that were available during my care of the patient were reviewed by me and considered in my medical decision making (see chart for details).   Patient is a pleasant, ill but nontoxic-appearing 34 year old male presenting today for flulike symptoms.  Vital signs are stable in triage and exam overall is reassuring.  He tests positive for influenza A at home today.  Will defer further testing and treat for influenza Tamiflu  twice a day for 5 days.  Also recommended cough suppressant medication and Zofran  every 8 hours as needed for nausea/vomiting.  Other supportive care discussed.  ER and return precautions discussed.  Work excuse provided.  The patient was given the opportunity to ask questions.  All questions answered to their satisfaction.  The patient is in agreement to this plan.   Final Clinical Impressions(s) / UC Diagnoses   Final diagnoses:  Influenza A     Discharge Instructions      You have influenza A.  Take the Tamiflu  twice daily for 5 days as prescribed to help you feel better sooner.  Symptoms should improve over the next week to 10 days.  If you develop chest pain or shortness of breath, go to the emergency room.   Some things that can  make you feel better are: - Increased rest - Increasing fluid with water/sugar free electrolytes - Acetaminophen  and ibuprofen  as needed for fever/pain - Salt water gargling, chloraseptic spray and throat lozenges - OTC guaifenesin (Mucinex) 600 mg twice daily for congestion - Saline sinus flushes or  a neti pot - Humidifying the air -Tessalon  Perles every 8 hours as needed for dry cough  - Zofran  under your tongue as needed for nausea/vomiting    ED Prescriptions     Medication Sig Dispense Auth. Provider   oseltamivir  (TAMIFLU ) 75 MG capsule Take 1 capsule (75 mg total) by mouth every 12 (twelve) hours for 5 days. 10 capsule Chandra Raisin A, NP   benzonatate  (TESSALON ) 100 MG capsule Take 1 capsule (100 mg total) by mouth 3 (three) times daily as needed for cough. Do not take with alcohol or while operating or driving heavy machinery 21 capsule Chandra Raisin A, NP   ondansetron  (ZOFRAN -ODT) 4 MG disintegrating tablet Take 1 tablet (4 mg total) by mouth every 8 (eight) hours as needed for nausea or vomiting. 20 tablet Chandra Raisin LABOR, NP      PDMP not reviewed this encounter.    [1]  Social History Tobacco Use   Smoking status: Every Day    Types: Cigarettes   Smokeless tobacco: Never  Vaping Use   Vaping status: Never Used  Substance Use Topics   Alcohol use: Not Currently    Comment: socially   Drug use: No     Chandra Raisin LABOR, NP 01/22/25 1614  "

## 2025-01-22 NOTE — Discharge Instructions (Signed)
 You have influenza A.  Take the Tamiflu  twice daily for 5 days as prescribed to help you feel better sooner.  Symptoms should improve over the next week to 10 days.  If you develop chest pain or shortness of breath, go to the emergency room.   Some things that can make you feel better are: - Increased rest - Increasing fluid with water/sugar free electrolytes - Acetaminophen  and ibuprofen  as needed for fever/pain - Salt water gargling, chloraseptic spray and throat lozenges - OTC guaifenesin (Mucinex) 600 mg twice daily for congestion - Saline sinus flushes or a neti pot - Humidifying the air -Tessalon  Perles every 8 hours as needed for dry cough  - Zofran  under your tongue as needed for nausea/vomiting

## 2025-01-22 NOTE — ED Triage Notes (Signed)
 Pt states cough,congestion,headache,body aches for the past 24 hours. States he took a home flu test and is positive for flu A.
